# Patient Record
Sex: Female | Born: 1975 | Race: White | Hispanic: No | Marital: Single | Smoking: Never smoker
Health system: Southern US, Community
[De-identification: ages and names within clinical notes are randomized; demographics above are authoritative.]

## PROBLEM LIST (undated history)

## (undated) DIAGNOSIS — C801 Malignant (primary) neoplasm, unspecified: Secondary | ICD-10-CM

## (undated) DIAGNOSIS — I10 Essential (primary) hypertension: Secondary | ICD-10-CM

## (undated) DIAGNOSIS — T148XXA Other injury of unspecified body region, initial encounter: Secondary | ICD-10-CM

## (undated) DIAGNOSIS — I872 Venous insufficiency (chronic) (peripheral): Secondary | ICD-10-CM

## (undated) HISTORY — PX: OPEN ANTERIOR SHOULDER RECONSTRUCTION: SHX2100

## (undated) HISTORY — PX: FOOT SURGERY: SHX648

## (undated) HISTORY — PX: CERVICAL SPINE SURGERY: SHX589

## (undated) HISTORY — DX: Malignant (primary) neoplasm, unspecified: C80.1

## (undated) HISTORY — DX: Essential (primary) hypertension: I10

## (undated) HISTORY — DX: Venous insufficiency (chronic) (peripheral): I87.2

## (undated) HISTORY — PX: THYROIDECTOMY: SHX17

## (undated) HISTORY — DX: Other injury of unspecified body region, initial encounter: T14.8XXA

---

## 2001-09-21 ENCOUNTER — Other Ambulatory Visit: Admission: RE | Admit: 2001-09-21 | Discharge: 2001-09-21 | Payer: Self-pay | Admitting: *Deleted

## 2004-02-01 ENCOUNTER — Inpatient Hospital Stay (HOSPITAL_COMMUNITY): Admission: RE | Admit: 2004-02-01 | Discharge: 2004-02-01 | Payer: Self-pay | Admitting: Neurosurgery

## 2004-03-06 ENCOUNTER — Encounter: Admission: RE | Admit: 2004-03-06 | Discharge: 2004-03-06 | Payer: Self-pay | Admitting: Neurosurgery

## 2004-04-17 ENCOUNTER — Encounter: Admission: RE | Admit: 2004-04-17 | Discharge: 2004-04-17 | Payer: Self-pay | Admitting: Neurosurgery

## 2004-11-20 ENCOUNTER — Other Ambulatory Visit: Admission: RE | Admit: 2004-11-20 | Discharge: 2004-11-20 | Payer: Self-pay | Admitting: *Deleted

## 2004-12-05 ENCOUNTER — Other Ambulatory Visit: Admission: RE | Admit: 2004-12-05 | Discharge: 2004-12-05 | Payer: Self-pay | Admitting: Diagnostic Radiology

## 2005-01-22 ENCOUNTER — Encounter (INDEPENDENT_AMBULATORY_CARE_PROVIDER_SITE_OTHER): Payer: Self-pay | Admitting: *Deleted

## 2005-01-22 ENCOUNTER — Inpatient Hospital Stay (HOSPITAL_COMMUNITY): Admission: RE | Admit: 2005-01-22 | Discharge: 2005-01-23 | Payer: Self-pay | Admitting: Surgery

## 2005-02-12 ENCOUNTER — Encounter (HOSPITAL_COMMUNITY): Admission: RE | Admit: 2005-02-12 | Discharge: 2005-04-02 | Payer: Self-pay | Admitting: Endocrinology

## 2005-02-21 ENCOUNTER — Ambulatory Visit (HOSPITAL_COMMUNITY): Admission: RE | Admit: 2005-02-21 | Discharge: 2005-02-21 | Payer: Self-pay | Admitting: Endocrinology

## 2006-03-19 ENCOUNTER — Encounter: Admission: RE | Admit: 2006-03-19 | Discharge: 2006-03-19 | Payer: Self-pay | Admitting: Endocrinology

## 2006-07-14 ENCOUNTER — Other Ambulatory Visit: Admission: RE | Admit: 2006-07-14 | Discharge: 2006-07-14 | Payer: Self-pay | Admitting: Obstetrics and Gynecology

## 2007-04-29 ENCOUNTER — Encounter: Admission: RE | Admit: 2007-04-29 | Discharge: 2007-04-29 | Payer: Self-pay | Admitting: Endocrinology

## 2008-08-29 ENCOUNTER — Encounter: Admission: RE | Admit: 2008-08-29 | Discharge: 2008-08-29 | Payer: Self-pay | Admitting: Family Medicine

## 2009-05-08 ENCOUNTER — Encounter (HOSPITAL_COMMUNITY): Admission: RE | Admit: 2009-05-08 | Discharge: 2009-07-20 | Payer: Self-pay | Admitting: Endocrinology

## 2010-04-29 ENCOUNTER — Encounter: Payer: Self-pay | Admitting: Endocrinology

## 2010-06-27 LAB — HCG, SERUM, QUALITATIVE

## 2010-08-24 NOTE — Op Note (Signed)
NAMEMULAN, ADAN NO.:  [id number]   MEDICAL RECORD NO.:  [id number]          PATIENT TYPE:  OIB   LOCATION:  2899                         FACILITY:  [facility]   PHYSICIAN:  Donalee Citrin, M.D.        DATE OF BIRTH:  04/07/1976   DATE OF PROCEDURE:  02/01/2004  DATE OF DISCHARGE:                                 OPERATIVE REPORT   PREOPERATIVE DIAGNOSIS:  C6 radiculopathy, left greater than right, from  ruptured disk with cervical spondylosis of C5-C6.   POSTOPERATIVE DIAGNOSIS:  C6 radiculopathy, left greater than right, from  ruptured disk with cervical spondylosis of C5-C6.   PROCEDURE PERFORMED:  Intracervical diskectomy and fusion C5-C6 using a 6-mm  patellar wedge, a 23-mm  __________ plate and four 16-XW variable-angled screws.   SURGEON:  Donalee Citrin, M.D.   ASSISTANT:  Tia Alert, MD   ANESTHESIA:  General endotracheal.   HISTORY OF PRESENT ILLNESS:  The patient is a very pleasant 35 year old  female who has had longstanding neck and bilateral arm pain, worse on the  left, radiating down what appeared to be a C6 distribution.  The patient was  refractory to all forms of conservative treatment with physical therapy,  anti-inflammatories and time.  Preoperative imaging showed severe  degenerative disk disease of C5-C6 with spondylitic compression of the  spinal cord both centrally and to the right.  The patient also had kyphotic  deformity at this level.  Due to the patient's degree of cervical stenosis,  failure of treatment, and C6 radiculopathy, the patient was recommended  intracervical diskectomy and fusion.  I essentially went over the  risks and  benefits of surgery with her and she understands and agreed to proceed  forward.   DESCRIPTION OF PROCEDURE:  The patient was brought to the OR where she  received general anesthesia, positioned supine.  Neck slight extension, 5  pounds Halter traction.  The right side of her neck was prepped and  draped  in the usual sterile fashion.  An intraoperative x-ray was utilized to  localize the C6 vertebral body.  A curvilinear incision was made just off  the midline to the anterior border of the sternocleidomastoid.  The  superficial layer of the platysma was dissected out and divided out  longitudinally and the avascular plane to the sternocleidomastoid and strap  muscles was developed down to the prevertebral fascia.  The fasicia was  dissected away with  Kitners.  Then the fluoroscopic confirmation of the C4-  5 disc space one level above was achieved and the annulotomy was made one  disc space below this at C5-6.  Then the longus colli was reflected  laterally with Bovie electrocautery.  Self-retaining retractor was placed.  Annulotomy was extended and disc space was radically cleaned out with a  combination of pituitary rongeurs, BA upgoing curette and high speed drill.  Then the operating microscope was draped and brought into the field.  Under  microscopic visualization, the under surface of the annulus and posterior  longitudinal ligament was under bitten with a  1 mm Kerrison punch  decompressing the left side of the thecal sac and central canal and the  proximal left C6 nerve root.  The C6 nerve root was immediately identified  and the foramen was opened up with __________  Kerrison punch.  Then  attention was taken to get across the midline.  In the midline and to the  right was a tremendous amount of spinal cord compression and thecal sac  compression from spondylitic spur coming off the C5 vertebral body.  This  was all under bitten very carefully with a 1 to 2 mm Kerrison punch.  We  removed the spur and immediately the thecal sac returned to its anatomic  position and the proximal C6 neural foramen on the right was opened up.  Then both neural foramen were explored with a black nerve hook noted to be  widely patent.  The wound was then copiously irrigated and meticulous   hemostasis was maintained.  There was no further stenosis on the thecal sac  centrally or either C6 nerve root.  __________  bone graft, then a 6 mm  patellar wedge was fashioned, sized and inserted under compression and a 23  mm Atlantis plate was selected and placed with four 13 mm variable-angled  screws.  All screws had excellent purchase.  Set screws tightened down.  Wound was copiously irrigated and meticulous hemostasis was maintained.  Fluoroscopy confirmed good position of plate, screws and bone graft.  Then  platysma was reapproximated with 3-0 interrupted Vicryl and subcutaneous  tissue closed with 4-0 running subcuticular and Benzoin and Steri-Strips  applied.  The patient went to the recovery room in stable condition.  At the  end of the case sponge and needle counts were correct.       GC/MEDQ  D:  02/01/2004  T:  02/01/2004  Job:  [id number]

## 2010-08-24 NOTE — Op Note (Signed)
NAMECHERLYNN, Kaitlyn Mccarthy NO.:  [id number]   MEDICAL RECORD NO.:  [id number]          PATIENT TYPE:  INP   LOCATION:  2899                         FACILITY:  [facility]   PHYSICIAN:  Velora Heckler, MD      DATE OF BIRTH:  [birth year]   DATE OF PROCEDURE:  01/22/2005  DATE OF DISCHARGE:                                 OPERATIVE REPORT   PREOPERATIVE DIAGNOSIS:  Right thyroid nodule with atypia   POSTOPERATIVE DIAGNOSIS:  Papillary thyroid carcinoma   PROCEDURE:  Total thyroidectomy with limited lymph node dissection   SURGEON:  Velora Heckler, M.D.   ASSISTANT:  Francina Ames, M.D.   ANESTHESIA:  General per Dr. 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 [place] in [place]. On routine physical  exam she was found on August 2006, to have a right-sided thyroid nodule. The  patient underwent ultrasound which demonstrated a 2.2 cm solid mass. 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 The patient  was referred for excision.   DESCRIPTION OF PROCEDURE:  Procedure is done in OR #17 at the [facility]. The patient is brought to the operating room, placed in  supine position on the operating room table. Following administration of  general anesthesia, the patient is positioned and then prepped and draped in  usual strict aseptic fashion. After ascertaining that an adequate level of  anesthesia had been obtained, a Kocher incision is made with a #15 blade.  Dissection is carried through subcutaneous tissues and platysma. Hemostasis  is obtained with the electrocautery. Skin flaps are developed cephalad and  caudad from the thyroid notch to the sternal notch. A Mahorner self-  retaining  retractor is placed for exposure. Strap muscles are incised in the  midline. Dissection is begun on the left side of the neck. Strap muscles are  reflected laterally and the left thyroid lobe was exposed. Left thyroid lobe  appears grossly normal. On palpation, there are no masses. There are no  nodules. There is no gross abnormality identified.   Next we turned our attention to the right thyroid lobe. Again strap muscles  are reflected laterally and the right lobe is exposed. Venous tributaries  are dissected out and ligated between small Ligaclips. Gland is rolled  anteriorly. Superior pole is dissected out. Superior pole vessels are  ligated in continuity with 2-0 silk ties and medium Ligaclips and divided.  Inferior venous tributaries are divided between small and medium Ligaclips.  Gland is rolled anteriorly. Superior and inferior parathyroid glands are  identified. Recurrent laryngeal nerve is identified and preserved. Branches  of the inferior thyroid artery are divided between small Ligaclips. Gland is  rolled up and onto the anterior trachea. There is a large 2.5 cm firm mass  in the posterior aspect of the mid portion of  the right thyroid lobe. This  is worrisome for papillary thyroid cancer. Remaining branches of the  inferior artery are divided and the gland is excised off of the trachea  after dividing the ligament of Berry. The gland is mobilized across the  isthmus and the isthmus is divided between hemostats and suture ligated with  3-0 Vicryl suture ligature. Entire right lobe is submitted to pathology for  frozen section. Frozen section demonstrates findings consistent with  papillary thyroid carcinoma. Palpable lymph nodes in the right neck are then  carefully dissected out. These are in the right paratracheal and in the  central compartment. These are excised and submitted separately to pathology  for review.   Next we turned our attention to the left thyroid lobe.  Left thyroid lobe is  mobilized. After receiving a frozen section diagnosis from Dr. Berneta Levins  of papillary thyroid cancer, a decision is made to proceed with left thyroid  lobectomy. Venous tributaries are divided between small Ligaclips. Inferior  venous tributaries are divided between medium Ligaclips. Superior pole is  dissected out. Superior pole vessels are ligated in continuity with 2-0 silk  ties and medium Ligaclips and divided. Gland is rolled anteriorly. Superior  and inferior parathyroid glands are identified and preserved. Recurrent  laryngeal nerve is identified and preserved. Branches of the inferior  thyroid artery are divided between small Ligaclips. Ligament of Allyson Sabal is  transected with the electrocautery and the remaining gland and large  pyramidal lobe are excised off of the trachea and thyroid cartilage with  electrocautery. This was submitted to pathology for review. Good hemostasis  is noted both sides of the neck. Surgicel was placed over the area of the  recurrent nerve and parathyroid glands bilaterally. Further palpation  reveals no other significant lymphadenopathy. Strap muscles are  reapproximated in the midline with interrupted 3-0 Vicryl sutures. Platysma  is closed with interrupted 3-0 Vicryl sutures. Skin edges are reapproximated  with a running 4-0 Vicryl subcuticular suture. Wound is washed and dried.  Benzoin and Steri-Strips are applied. Sterile dressings are applied. The  patient is awakened from anesthesia and brought to the recovery room in  stable condition. The patient tolerated the procedure well.      Velora Heckler, MD  Electronically Signed     TMG/MEDQ  D:  01/22/2005  T:  01/22/2005  Job:  [id number]   cc:   Maren Reamer, N.PDeboraha Sprang Physicians

## 2011-11-13 ENCOUNTER — Encounter: Payer: Self-pay | Admitting: Vascular Surgery

## 2011-12-03 ENCOUNTER — Encounter: Payer: Self-pay | Admitting: Vascular Surgery

## 2011-12-04 ENCOUNTER — Encounter: Payer: Self-pay | Admitting: Vascular Surgery

## 2011-12-04 ENCOUNTER — Ambulatory Visit (INDEPENDENT_AMBULATORY_CARE_PROVIDER_SITE_OTHER): Payer: BC Managed Care – PPO | Admitting: Vascular Surgery

## 2011-12-04 ENCOUNTER — Ambulatory Visit (INDEPENDENT_AMBULATORY_CARE_PROVIDER_SITE_OTHER): Payer: BC Managed Care – PPO | Admitting: *Deleted

## 2011-12-04 VITALS — BP 136/78 | HR 96 | Resp 16 | Ht 67.5 in | Wt 213.0 lb

## 2011-12-04 DIAGNOSIS — I83893 Varicose veins of bilateral lower extremities with other complications: Secondary | ICD-10-CM

## 2011-12-04 NOTE — Progress Notes (Signed)
[facility] [place]  Patient name: Kaitlyn Mccarthy MRN: [id number] DOB: March 09, 1976 Sex: female  REASON FOR CONSULT: left leg pain related to chronic venous insufficiency.  HPI: Kaitlyn Mccarthy is a 35 y.o. female who underwent laser ablation of her right greater saphenous vein in April of this year at [facility]. She had significant truncal varicosities in the right leg and also incompetence of the right greater saphenous vein. After the procedure she had difficulty bending her knee and had significant pain and ecchymosis along the medial aspect of her right thigh and around the knee joint. This has subsequently resolved.  Currently she is having significant aching pain in her left leg associated with standing. Unfortunately she asked to stand for a prolonged period of time at work. She experiences tiredness in the left leg and has been wearing thigh-high compression stockings with a 20-30 mm of mercury pressure gradient without significant relief. She wishes to consider other options.  She is unaware of any history of DVT. It sounds like she did have some phlebitis in the right leg after her previous procedure. 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  Past Medical History  Diagnosis Date  . Cancer     thyroid    Family History  Problem Relation Age of Onset  . Cancer Mother     testicular  . Hyperlipidemia Mother   . Hypertension Mother   . Hyperlipidemia Father   . Hypertension Father     SOCIAL HISTORY: History  Substance Use Topics  . Smoking status: Never Smoker   . Smokeless tobacco: Never Used  . Alcohol Use: No    Allergies  Allergen Reactions  . Erythromycin     Pt states that she has severe stomach pain    Current Outpatient Prescriptions  Medication Sig Dispense Refill  . metoprolol succinate (TOPROL-XL) 50 MG 24 hr tablet Take 50 mg by mouth as needed. Take with or immediately following a meal.      .  SYNTHROID 112 MCG tablet Take 112 mcg by mouth daily.        REVIEW OF SYSTEMS: Arly.Keller ] denotes positive finding; [  ] denotes negative finding  CARDIOVASCULAR:  [ ]  chest pain   [ ]  chest pressure   Arly.Keller ] palpitations   [ ]  orthopnea   [ ]  dyspnea on exertion   [ ]  claudication   [ ]  rest pain   [ ]  DVT   [ ]  phlebitis PULMONARY:   [ ]  productive cough   [ ]  asthma   [ ]  wheezing NEUROLOGIC:   [ ]  weakness  [ ]  paresthesias  [ ]  aphasia  [ ]  amaurosis  [ ]  dizziness HEMATOLOGIC:   [ ]  bleeding problems   [ ]  clotting disorders MUSCULOSKELETAL:  [ ]  joint pain   [ ]  joint swelling Arly.Keller ] leg swelling GASTROINTESTINAL: [ ]   blood in stool  [ ]   hematemesis GENITOURINARY:  [ ]   dysuria  [ ]   hematuria PSYCHIATRIC:  [ ]  history of major depression INTEGUMENTARY:  [ ]  rashes  [ ]  ulcers CONSTITUTIONAL:  [ ]  fever   [ ]  chills  PHYSICAL EXAM: Filed Vitals:   12/04/11 1028  BP: 136/78  Pulse: 96  Resp: 16  Height: 5' 7.5" (1.715 m)  Weight: 213 lb (96.616 kg)  SpO2: 100%   Body mass index is 32.87 kg/(m^2). GENERAL: The patient is a well-nourished female, in  no acute distress. The vital signs are documented above. CARDIOVASCULAR: There is a regular rate and rhythm without significant murmur appreciated. I do not detect carotid bruits. She has palpable popliteal and posterior tibial pulses bilaterally. PULMONARY: There is good air exchange bilaterally without wheezing or rales. ABDOMEN: Soft and non-tender with normal pitched bowel sounds.  MUSCULOSKELETAL: There are no major deformities or cyanosis. NEUROLOGIC: No focal weakness or paresthesias are detected. SKIN: she has multiple small indurated areas were she had sclerotherapy in the right leg. On the left side she has spider veins and some larger varicose veins along the medial distal aspect of her right side. PSYCHIATRIC: The patient has a normal affect.  DATA:  I have independently interpreted her venous duplex scan in reflux study.  This shows incompetence of the left greater saphenous vein to the proximal calf. There is also some deep vein reflux. Diameters of the vein range from 0.22 cm to 0.67 cm at the saphenofemoral junction. The vein appears to be fairly large in the proximal thigh but is smaller distally near the knee.  MEDICAL ISSUES: This patient has significant left leg pain associated with her venous insufficiency in the left leg. This pain has persisted despite wearing her compression stockings since her procedure in April. I think it would be reasonable to proceed with laser ablation of the left greater saphenous vein and have discussed this with Dr. Arbie Cookey. She currently has her thigh high compression stockings with a 20-30 mm mercury gradient which she will wear her after the procedure. We have discussed the procedure with her today in the office all of her questions were answered. We'll try to schedule a that appointment within the next month.   DICKSON,CHRISTOPHER S [facility] [place] Beeper: [phone]

## 2011-12-10 ENCOUNTER — Other Ambulatory Visit: Payer: Self-pay | Admitting: *Deleted

## 2011-12-10 DIAGNOSIS — I83893 Varicose veins of bilateral lower extremities with other complications: Secondary | ICD-10-CM

## 2011-12-11 ENCOUNTER — Encounter: Payer: Self-pay | Admitting: Vascular Surgery

## 2012-01-22 ENCOUNTER — Encounter: Payer: Self-pay | Admitting: Vascular Surgery

## 2012-01-23 ENCOUNTER — Ambulatory Visit (INDEPENDENT_AMBULATORY_CARE_PROVIDER_SITE_OTHER): Payer: BC Managed Care – PPO | Admitting: Vascular Surgery

## 2012-01-23 ENCOUNTER — Encounter: Payer: Self-pay | Admitting: Vascular Surgery

## 2012-01-23 VITALS — BP 137/97 | HR 85 | Resp 18 | Ht 67.0 in | Wt 211.0 lb

## 2012-01-23 DIAGNOSIS — I872 Venous insufficiency (chronic) (peripheral): Secondary | ICD-10-CM | POA: Insufficient documentation

## 2012-01-23 DIAGNOSIS — I83893 Varicose veins of bilateral lower extremities with other complications: Secondary | ICD-10-CM

## 2012-01-23 HISTORY — PX: ENDOVENOUS ABLATION SAPHENOUS VEIN W/ LASER: SUR449

## 2012-01-23 NOTE — Progress Notes (Signed)
Laser Ablation Procedure      Date: 01/23/2012    Kaitlyn Mccarthy DOB:[birth year]  Consent signed: Yes  Surgeon:T.F. Joron Velis  Procedure: Laser Ablation: left Greater Saphenous Vein  BP 137/97  Pulse 85  Resp 18  Ht 5\' 7"  (1.702 m)  Wt 211 lb (95.709 kg)  BMI 33.05 kg/m2  Start time: 8:30am   End time: 9:30am  Tumescent Anesthesia: 400 cc 0.9% NaCl with 50 cc Lidocaine HCL with 1% Epi and 15 cc 8.4% NaHCO3  Local Anesthesia: 3 cc Lidocaine HCL and NaHCO3 (ratio 2:1)  Continuous Mode: 15 Watts Total Energy 2209 Joules Total Time2:27       Patient tolerated procedure well: Yes    Description of Procedure:  After marking the course of the saphenous vein and the secondary varicosities in the standing position, the patient was placed on the operating table in the supine position, and the left leg was prepped and draped in sterile fashion. Local anesthetic was administered, and under ultrasound guidance the saphenous vein was accessed with a micro needle and guide wire; then the micro puncture sheath was placed. A guide wire was inserted to the saphenofemoral junction, followed by a 5 french sheath.  The position of the sheath and then the laser fiber below the junction was confirmed using the ultrasound and visualization of the aiming beam.  Tumescent anesthesia was administered along the course of the saphenous vein using ultrasound guidance. Protective laser glasses were placed on the patient, and the laser was fired at at 15 watt continuous mode.  For a total of 2209 joules.  A steri strip was applied to the puncture site.      ABD pads and thigh high compression stockings were applied.  Ace wrap bandages were applied  at the top of the saphenofemoral junction.  Blood loss was less than 15 cc.  The patient ambulated out of the operating room having tolerated the procedure well.  Uneventful ablation of her left great saphenous vein from the proximal calf to the just below the  saphenofemoral junction. Will be seen again in one week

## 2012-01-27 ENCOUNTER — Telehealth: Payer: Self-pay | Admitting: *Deleted

## 2012-01-27 NOTE — Telephone Encounter (Signed)
01/27/2012  Time: 1:49 PM   Patient Name: Kaitlyn Mccarthy  Patient of: T.F. Early  Procedure:Laser Ablation left  01-23-2012   Reached patient at home and checked  Her status  Yes    Comments/Actions Taken: Ms. Shuffield states that she has minimal discomfort left inner thigh relieved by Ibuprofen and ice packs.  Ms. Collet states no bleeding or oozing today but that she did experience small amount of bleeding on her ABD pad yesterday (01-23-2012) that stopped spontaneously.  Reviewed post procedural instructions and answered  her questions about exercise.  Reminded her of post laser ablation duplex and follow up appointment with Dr. Arbie Cookey on 01-30-2012.      @SIGNATURE @

## 2012-01-29 ENCOUNTER — Encounter: Payer: Self-pay | Admitting: Vascular Surgery

## 2012-01-30 ENCOUNTER — Encounter (INDEPENDENT_AMBULATORY_CARE_PROVIDER_SITE_OTHER): Payer: BC Managed Care – PPO | Admitting: *Deleted

## 2012-01-30 ENCOUNTER — Ambulatory Visit (INDEPENDENT_AMBULATORY_CARE_PROVIDER_SITE_OTHER): Payer: BC Managed Care – PPO | Admitting: Vascular Surgery

## 2012-01-30 ENCOUNTER — Encounter: Payer: Self-pay | Admitting: Vascular Surgery

## 2012-01-30 VITALS — BP 140/83 | HR 77 | Resp 18 | Ht 67.0 in | Wt 211.0 lb

## 2012-01-30 DIAGNOSIS — M7989 Other specified soft tissue disorders: Secondary | ICD-10-CM

## 2012-01-30 DIAGNOSIS — Z48812 Encounter for surgical aftercare following surgery on the circulatory system: Secondary | ICD-10-CM

## 2012-01-30 DIAGNOSIS — I83893 Varicose veins of bilateral lower extremities with other complications: Secondary | ICD-10-CM

## 2012-01-30 NOTE — Progress Notes (Signed)
The patient presents today for followup of her left great saphenous vein laser ablation one week ago. She had her right leg procedure done in an outlying vein center in [place] discomfort at and not a very doesn't experience. Portion she had minimal discomfort with the procedure last week and has had mild discomfort since the procedure.  Past Medical History  Diagnosis Date  . Cancer     thyroid  . Venous insufficiency     History  Substance Use Topics  . Smoking status: Never Smoker   . Smokeless tobacco: Never Used  . Alcohol Use: No    Family History  Problem Relation Age of Onset  . Cancer Mother     testicular  . Hyperlipidemia Mother   . Hypertension Mother   . Hyperlipidemia Father   . Hypertension Father     Allergies  Allergen Reactions  . Erythromycin     Pt states that she has severe stomach pain    Current outpatient prescriptions:metoprolol succinate (TOPROL-XL) 50 MG 24 hr tablet, Take 50 mg by mouth as needed. Take with or immediately following a meal., Disp: , Rfl: ;  SYNTHROID 112 MCG tablet, Take 112 mcg by mouth daily., Disp: , Rfl:   BP 140/83  Pulse 77  Resp 18  Ht 5\' 7"  (1.702 m)  Wt 211 lb (95.709 kg)  BMI 33.05 kg/m2  Body mass index is 33.05 kg/(m^2).       Physical exam reveals mild bruising on the medial aspect of her thigh. She has no evidence of injury to her skin. She did undergo a venous duplex today and that shows no evidence of DVT and also for chest closure of her great saphenous vein throughout its treatment source.  Impression and plan successful laser ablation of left great saphenous vein. She will continue her compression garments for an additional one week and then on a when necessary basis. She will resume full activity

## 2014-03-22 ENCOUNTER — Other Ambulatory Visit: Payer: Self-pay | Admitting: Endocrinology

## 2014-03-22 DIAGNOSIS — C73 Malignant neoplasm of thyroid gland: Secondary | ICD-10-CM

## 2014-04-25 ENCOUNTER — Encounter (HOSPITAL_COMMUNITY)
Admission: RE | Admit: 2014-04-25 | Discharge: 2014-04-25 | Disposition: A | Discharge status: Home / Self Care | Payer: BLUE CROSS/BLUE SHIELD | Source: Ambulatory Visit | Attending: Endocrinology | Admitting: Endocrinology

## 2014-04-25 DIAGNOSIS — Z9889 Other specified postprocedural states: Secondary | ICD-10-CM | POA: Insufficient documentation

## 2014-04-25 DIAGNOSIS — C73 Malignant neoplasm of thyroid gland: Secondary | ICD-10-CM | POA: Insufficient documentation

## 2014-04-25 MED ORDER — THYROTROPIN ALFA 1.1 MG IM SOLR
0.9000 mg | INTRAMUSCULAR | Status: AC
  Administered 2014-04-25: 0.9 mg via INTRAMUSCULAR
  Filled 2014-04-25: qty 0.9

## 2014-04-26 ENCOUNTER — Encounter (HOSPITAL_COMMUNITY)
Admission: RE | Admit: 2014-04-26 | Discharge: 2014-04-26 | Disposition: A | Discharge status: Home / Self Care | Payer: BLUE CROSS/BLUE SHIELD | Source: Ambulatory Visit | Attending: Endocrinology | Admitting: Endocrinology

## 2014-04-26 DIAGNOSIS — C73 Malignant neoplasm of thyroid gland: Secondary | ICD-10-CM | POA: Diagnosis not present

## 2014-04-26 MED ORDER — THYROTROPIN ALFA 1.1 MG IM SOLR
0.9000 mg | INTRAMUSCULAR | Status: AC
  Administered 2014-04-26: 0.9 mg via INTRAMUSCULAR
  Filled 2014-04-26: qty 0.9

## 2014-04-27 ENCOUNTER — Encounter (HOSPITAL_COMMUNITY)
Admission: RE | Admit: 2014-04-27 | Discharge: 2014-04-27 | Disposition: A | Discharge status: Home / Self Care | Payer: BLUE CROSS/BLUE SHIELD | Source: Ambulatory Visit | Attending: Endocrinology | Admitting: Endocrinology

## 2014-04-27 DIAGNOSIS — C73 Malignant neoplasm of thyroid gland: Secondary | ICD-10-CM | POA: Diagnosis not present

## 2014-04-27 LAB — HCG, SERUM, QUALITATIVE: Preg, Serum: NEGATIVE

## 2014-04-29 ENCOUNTER — Encounter (HOSPITAL_COMMUNITY)
Admission: RE | Admit: 2014-04-29 | Discharge: 2014-04-29 | Disposition: A | Discharge status: Home / Self Care | Payer: BLUE CROSS/BLUE SHIELD | Source: Ambulatory Visit | Attending: Endocrinology | Admitting: Endocrinology

## 2014-04-29 ENCOUNTER — Encounter (HOSPITAL_COMMUNITY): Payer: Self-pay

## 2014-04-29 DIAGNOSIS — C73 Malignant neoplasm of thyroid gland: Secondary | ICD-10-CM | POA: Diagnosis not present

## 2014-04-29 MED ORDER — SODIUM IODIDE I 131 CAPSULE
4.2000 | Freq: Once | INTRAVENOUS | Status: AC | PRN
  Administered 2014-04-29: 4.2 via ORAL

## 2014-12-07 ENCOUNTER — Other Ambulatory Visit: Payer: Self-pay | Admitting: Family Medicine

## 2014-12-07 ENCOUNTER — Ambulatory Visit
Admission: RE | Admit: 2014-12-07 | Discharge: 2014-12-07 | Disposition: A | Discharge status: Home / Self Care | Payer: BLUE CROSS/BLUE SHIELD | Source: Ambulatory Visit | Attending: Family Medicine | Admitting: Family Medicine

## 2014-12-07 DIAGNOSIS — M542 Cervicalgia: Secondary | ICD-10-CM

## 2016-06-22 ENCOUNTER — Emergency Department (HOSPITAL_BASED_OUTPATIENT_CLINIC_OR_DEPARTMENT_OTHER)
Admission: EM | Admit: 2016-06-22 | Discharge: 2016-06-22 | Disposition: A | Discharge status: Home / Self Care | Payer: No Typology Code available for payment source | Attending: Emergency Medicine | Admitting: Emergency Medicine

## 2016-06-22 ENCOUNTER — Encounter (HOSPITAL_BASED_OUTPATIENT_CLINIC_OR_DEPARTMENT_OTHER): Payer: Self-pay | Admitting: Emergency Medicine

## 2016-06-22 DIAGNOSIS — T380X5A Adverse effect of glucocorticoids and synthetic analogues, initial encounter: Secondary | ICD-10-CM | POA: Diagnosis not present

## 2016-06-22 DIAGNOSIS — T50905A Adverse effect of unspecified drugs, medicaments and biological substances, initial encounter: Secondary | ICD-10-CM

## 2016-06-22 DIAGNOSIS — R202 Paresthesia of skin: Secondary | ICD-10-CM | POA: Diagnosis present

## 2016-06-22 MED ORDER — LORAZEPAM 1 MG PO TABS
1.0000 mg | ORAL_TABLET | Freq: Once | ORAL | Status: AC
  Administered 2016-06-22: 1 mg via ORAL
  Filled 2016-06-22: qty 1

## 2016-06-22 NOTE — ED Provider Notes (Signed)
[facility] DEPT [facility] Provider Note   CSN: [id number] Arrival date & time: 06/22/16  1500  By signing my name below, I, [name], attest that this documentation has been prepared under the direction and in the presence of non-physician practitioner, Janetta Hora, PA-C. Electronically Signed: [name], Scribe. 06/22/2016. 5:31 PM.  History   Chief Complaint Chief Complaint  Patient presents with  . Medication Reaction   The history is provided by the patient. No language interpreter was used.   HPI Comments: Kaitlyn Mccarthy is a 41 y.o. female who presents to the Emergency Department for a medication reaction that started 3 days ago. She has been taking daily 50 mg Prednisone for back pain with sciatica. The steroid has improved her back pain but since taking it she has had associated anxiety and facial tightness/tingling sensation as well as hyperactivity. She feels as if her "face is swollen but it is not". She halved her dose since 2 days ago, which has made her symptoms worse. Her last dose was 7.5 hours ago (10am) and she subsequently developed symptoms again. She denies any throat swelling, wheezing, numbness, or other complaints. She states she has taken prednisone in the past without a reaction but has never taken this high of a dose.   PCP: Vidal Schwalbe, MD  Past Medical History:  Diagnosis Date  . Cancer ([place])    thyroid  . Venous insufficiency     Patient Active Problem List   Diagnosis Date Noted  . Unspecified venous (peripheral) insufficiency 01/23/2012  . Varicose veins of lower extremities with other complications 51/76/1607    Past Surgical History:  Procedure Laterality Date  . CERVICAL SPINE SURGERY    . ENDOVENOUS ABLATION SAPHENOUS VEIN W/ LASER  01-23-2012   left greater saphenous vein by  Dr. Donnetta Hutching  . FOOT SURGERY    . THYROIDECTOMY      OB History    No data available       Home Medications    Prior to Admission medications     Medication Sig Start Date End Date Taking? Authorizing Provider  metoprolol succinate (TOPROL-XL) 50 MG 24 hr tablet Take 50 mg by mouth as needed. Take with or immediately following a meal.   Yes Historical Provider, MD  SYNTHROID 112 MCG tablet Take 112 mcg by mouth daily. 11/06/11  Yes Historical Provider, MD    Family History Family History  Problem Relation Age of Onset  . Cancer Mother     testicular  . Hyperlipidemia Mother   . Hypertension Mother   . Hyperlipidemia Father   . Hypertension Father     Social History Social History  Substance Use Topics  . Smoking status: Never Smoker  . Smokeless tobacco: Never Used  . Alcohol use No     Allergies   Erythromycin; Prednisone; and Vancomycin   Review of Systems Review of Systems  HENT: Negative for trouble swallowing.        +Facial tightness/tingling  Respiratory: Negative for wheezing.   Neurological: Negative for numbness.  Psychiatric/Behavioral: The patient is nervous/anxious.   All other systems reviewed and are negative.    Physical Exam Updated Vital Signs BP (!) 160/105 (BP Location: Right Arm)   Pulse 97   Temp 98.5 F (36.9 C) (Oral)   Resp 20   Ht 5\' 7"  (1.702 m)   Wt 215 lb (97.5 kg)   LMP 05/28/2016   SpO2 100%   BMI 33.67 kg/m   Physical Exam  Constitutional: She appears well-developed and well-nourished. No distress.  Speaking in full sentences.   HENT:  Head: Normocephalic.  No throat swelling.   Eyes: Conjunctivae are normal.  Neck: Neck supple.  Cardiovascular: Normal rate and regular rhythm.  Exam reveals no gallop and no friction rub.   No murmur heard. Pulmonary/Chest: Effort normal and breath sounds normal. No respiratory distress. She has no wheezes. She has no rales. She exhibits no tenderness.  Abdominal: Soft.  Musculoskeletal: Normal range of motion.  Neurological: She is alert.  Skin: Skin is warm and dry.  Psychiatric: She has a normal mood and affect.  Nursing  note and vitals reviewed.    ED Treatments / Results  DIAGNOSTIC STUDIES: Oxygen Saturation is 100% on RA, normal by my interpretation.    COORDINATION OF CARE: 5:35 PM- Pt advised of plan for treatment and pt agrees.  Labs (all labs ordered are listed, but only abnormal results are displayed) Labs Reviewed - No data to display  EKG  EKG Interpretation None       Radiology No results found.  Procedures Procedures (including critical care time)  Medications Ordered in ED Medications  LORazepam (ATIVAN) tablet 1 mg (1 mg Oral Given 06/22/16 1806)     Initial Impression / Assessment and Plan / ED Course  I have reviewed the triage vital signs and the nursing notes.  Pertinent labs & imaging results that were available during my care of the patient were reviewed by me and considered in my medical decision making (see chart for details).  41 year old female with med reaction to prednisone. No signs of allergic reaction. Ativan given in ED. Advised stop Prednisone since back has improved. Follow up with PCP. Return precautions given.  Final Clinical Impressions(s) / ED Diagnoses   Final diagnoses:  Medication reaction, initial encounter    New Prescriptions New Prescriptions   No medications on file   I personally performed the services described in this documentation, which was scribed in my presence. The recorded information has been reviewed and is accurate.     Recardo Evangelist, PA-C 06/23/16 1207    Quintella Reichert, MD 06/23/16 [phone]

## 2016-06-22 NOTE — ED Triage Notes (Signed)
Pt has been taking prednisone once per day since Wednesday. Pt states she feels anxious and her face feels tight every time she takes it.

## 2016-06-22 NOTE — ED Notes (Signed)
Pt discharged to home with family. NAD.  

## 2016-06-22 NOTE — Discharge Instructions (Signed)
Stop Prednisone Take Benadryl as needed for anxiety Return for worsening symptoms

## 2016-06-22 NOTE — ED Notes (Signed)
Pt states she has been taking Prednisone and feels like she is now allergic to it. States she feels like her face is swollen and has anxiety. Family with patient states her face looks like her normal and does not appear to be swollen compared to her norm. Pt acts nervous and is shaking.

## 2017-04-18 ENCOUNTER — Ambulatory Visit (INDEPENDENT_AMBULATORY_CARE_PROVIDER_SITE_OTHER): Payer: No Typology Code available for payment source | Admitting: Internal Medicine

## 2017-04-18 ENCOUNTER — Encounter (INDEPENDENT_AMBULATORY_CARE_PROVIDER_SITE_OTHER): Payer: Self-pay

## 2017-04-18 ENCOUNTER — Encounter: Payer: Self-pay | Admitting: Internal Medicine

## 2017-04-18 VITALS — BP 152/96 | HR 90 | Resp 16 | Ht 67.5 in | Wt 224.8 lb

## 2017-04-18 DIAGNOSIS — E890 Postprocedural hypothyroidism: Secondary | ICD-10-CM | POA: Diagnosis not present

## 2017-04-18 DIAGNOSIS — R002 Palpitations: Secondary | ICD-10-CM

## 2017-04-18 NOTE — Patient Instructions (Signed)
Dr. Debara Pickett recommends that you schedule a follow-up appointment as needed.

## 2017-04-19 ENCOUNTER — Encounter: Payer: Self-pay | Admitting: Internal Medicine

## 2017-04-19 DIAGNOSIS — E890 Postprocedural hypothyroidism: Secondary | ICD-10-CM | POA: Insufficient documentation

## 2017-04-19 DIAGNOSIS — R002 Palpitations: Secondary | ICD-10-CM | POA: Insufficient documentation

## 2017-04-19 NOTE — Progress Notes (Signed)
OFFICE CONSULT NOTE  Chief Complaint:  Palpitations  Primary Care Physician: Maury Dus, MD  HPI:  Kaitlyn Mccarthy is a 42 y.o. female who is being seen today for the evaluation of palpitations at the request of Damaris Hippo, MD. 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 She has recently been having palpitations. She had had them in the past and thought they were PVC's. She has a history of hyperthyroidism and is s/p radioiodine ablation. Recently she was noted to be hyperthyroid and her dose was adjusted down, but she neglected to change her medicine. Her palpitations were worse afterwards and have improved with decreasing her thyroid supplementation dose. She has also stopped caffeine recently. She reports being in a high stress job with little support in the office - this is when she feels most of her palpitations. She rarely has symptoms on the weekends and is working with her father to renovate her house.  PMHx:  Past Medical History:  Diagnosis Date  . Cancer ([place])    thyroid  . Hypertension   . Venous insufficiency     Past Surgical History:  Procedure Laterality Date  . CERVICAL SPINE SURGERY    . ENDOVENOUS ABLATION SAPHENOUS VEIN W/ LASER  01-23-2012   left greater saphenous vein by  Dr. Donnetta Hutching  . FOOT SURGERY    . THYROIDECTOMY      FAMHx:  Family History  Problem Relation Age of Onset  . Cancer Mother        testicular  . Hyperlipidemia Mother   . Hypertension Mother   . Hyperlipidemia Father   . Hypertension Father     SOCHx:   reports that  has never smoked. she has never used smokeless tobacco. She reports that she does not drink alcohol or use drugs.  ALLERGIES:  Allergies  Allergen Reactions  . Erythromycin     Pt states that she has severe stomach pain  . Meloxicam Swelling    Ankle swelling   . Prednisone     With high doses (50mg )  . Lisinopril Cough  . Losartan Other (See Comments)    Tremors, lightheaded   .  Vancomycin     ROS: Pertinent items noted in HPI and remainder of comprehensive ROS otherwise negative.  HOME MEDS: Current Outpatient Medications on File Prior to Visit  Medication Sig Dispense Refill  . cetirizine (ZYRTEC) 10 MG tablet Take 10 mg by mouth at bedtime.    . hydrochlorothiazide (HYDRODIURIL) 12.5 MG tablet TAKE 1 TABLET BY MOUTH EVERY DAY IN THE MORNING  1  . LORazepam (ATIVAN) 0.5 MG tablet TAKE 1 TABLET AS NEEDED EVERY 8 HRS ORALLY  1  . metoprolol succinate (TOPROL-XL) 50 MG 24 hr tablet Take 50 mg by mouth as needed. Take with or immediately following a meal.    . SYNTHROID 125 MCG tablet PLEASE SEE ATTACHED FOR DETAILED DIRECTIONS  1   No current facility-administered medications on file prior to visit.     LABS/IMAGING: No results found for this or any previous visit (from the past 48 hour(s)). No results found.  LIPID PANEL: No results found for: CHOL, TRIG, HDL, CHOLHDL, VLDL, LDLCALC, LDLDIRECT  WEIGHTS: Wt Readings from Last 3 Encounters:  04/18/17 224 lb 12.8 oz (102 kg)  06/22/16 215 lb (97.5 kg)  01/30/12 211 lb (95.7 kg)    VITALS: BP (!) 152/96   Pulse 90   Resp 16   Ht 5' 7.5" (1.715  m)   Wt 224 lb 12.8 oz (102 kg)   LMP 03/30/2017   SpO2 100%   BMI 34.69 kg/m   EXAM: General appearance: alert and no distress Neck: no carotid bruit, no JVD and thyroid not enlarged, symmetric, no tenderness/mass/nodules Lungs: clear to auscultation bilaterally Heart: regular rate and rhythm, S1, S2 normal, no murmur, click, rub or gallop Abdomen: soft, non-tender; bowel sounds normal; no masses,  no organomegaly Extremities: extremities normal, atraumatic, no cyanosis or edema Pulses: 2+ and symmetric Skin: Skin color, texture, turgor normal. No rashes or lesions Neurologic: Grossly normal Psych: Pleasant  EKG: NSR At 85, QTC 495 msec - personally reviewed  ASSESSMENT: 1. Palpitations, likely related to hyperthyroidism  PLAN: 1.   Jenine  reports her palpitations have improved with the decrease in her thyroid medicine, reduction in caffeine and attempts to control stress. No further workup is necessary. She should try to keep the TSH at the upper level of normal range to reduce the chance of palpitations. Exercise, yoga or mindfulness activities may help mitigate her work stress.  Follow-up with me as needed.  Pixie Casino, MD, [facility], [place] Director of the [facility] Lipid Disorders &  Cardiovascular [facility] Reduction [facility] Diplomate of the American Board of Clinical Lipidology Attending Cardiologist  Direct Dial: [phone]  Fax: [phone]  Website:  [email or web address] 04/19/2017, 2:24 PM

## 2017-04-20 ENCOUNTER — Telehealth: Payer: Self-pay | Admitting: Internal Medicine

## 2017-04-20 NOTE — Telephone Encounter (Signed)
Cardiology Moonlighter Note  Returned page from patient. Feeling intermittent palpitations for the past couple of weeks. Saw Dr. Debara Pickett 2 days ago. Metoprolol dose was adjusted. Taking metoprolol succinate 50mg  once daily. Formerly was taking metoprolol tartrate, had some leftover tartrate tablets. Took an extra one and symptoms nearly resolved. Patient frustrated that she is having symptoms despite taking several different medications. She understands that her symptoms are likely caused by her thyroid dysfunction, which has been abnormal. She is currently having her synthroid dose adjusted.   I discussed with the patient a few options regarding her medications that could treat her symptoms. First option was to continue taking metoprolol succinate 50mg  daily in the AM and then just take metoprolol tartrate 25mg  as needed for palpitations. She could also take metoprolol succinate 50mg  twice daily for better 24 hour coverage in hopes that this would prevent her palpitations from recurring and making metoprolol tartrate unnecessary. She notes that her resting heart rates now are around 70 bpm and she is experiencing no adverse effects from metoprolol.   The patient stated that she prefers to try taking metoprolol succinate 50mg  BID to hopefully prevent future palpitations while her tyroid level is corrected. I will cc this note to Dr. Debara Pickett so that he is aware of this change. The patient will call back tonight if she experiences any more issues or has any questions.  Marcie Mowers, MD Cardiology Fellow, PGY-5

## 2019-06-04 ENCOUNTER — Other Ambulatory Visit: Payer: Self-pay | Admitting: Family Medicine

## 2019-06-04 DIAGNOSIS — Z1231 Encounter for screening mammogram for malignant neoplasm of breast: Secondary | ICD-10-CM

## 2019-06-06 DIAGNOSIS — H1032 Unspecified acute conjunctivitis, left eye: Secondary | ICD-10-CM | POA: Diagnosis not present

## 2019-06-06 DIAGNOSIS — S0502XA Injury of conjunctiva and corneal abrasion without foreign body, left eye, initial encounter: Secondary | ICD-10-CM | POA: Diagnosis not present

## 2019-08-16 ENCOUNTER — Ambulatory Visit: Payer: No Typology Code available for payment source

## 2019-09-02 DIAGNOSIS — E039 Hypothyroidism, unspecified: Secondary | ICD-10-CM | POA: Diagnosis not present

## 2019-09-02 DIAGNOSIS — E559 Vitamin D deficiency, unspecified: Secondary | ICD-10-CM | POA: Diagnosis not present

## 2019-09-02 DIAGNOSIS — J309 Allergic rhinitis, unspecified: Secondary | ICD-10-CM | POA: Diagnosis not present

## 2019-09-02 DIAGNOSIS — I10 Essential (primary) hypertension: Secondary | ICD-10-CM | POA: Diagnosis not present

## 2019-09-21 DIAGNOSIS — M25562 Pain in left knee: Secondary | ICD-10-CM | POA: Diagnosis not present

## 2019-09-27 ENCOUNTER — Ambulatory Visit
Admission: RE | Admit: 2019-09-27 | Discharge: 2019-09-27 | Disposition: A | Discharge status: Home / Self Care | Payer: No Typology Code available for payment source | Source: Ambulatory Visit | Attending: Family Medicine | Admitting: Family Medicine

## 2019-09-27 ENCOUNTER — Other Ambulatory Visit: Payer: Self-pay

## 2019-09-27 DIAGNOSIS — Z1231 Encounter for screening mammogram for malignant neoplasm of breast: Secondary | ICD-10-CM

## 2019-11-09 DIAGNOSIS — J0100 Acute maxillary sinusitis, unspecified: Secondary | ICD-10-CM | POA: Diagnosis not present

## 2019-11-10 DIAGNOSIS — Z20822 Contact with and (suspected) exposure to covid-19: Secondary | ICD-10-CM | POA: Diagnosis not present

## 2019-11-12 DIAGNOSIS — H33302 Unspecified retinal break, left eye: Secondary | ICD-10-CM | POA: Diagnosis not present

## 2019-11-16 ENCOUNTER — Other Ambulatory Visit: Payer: Self-pay | Admitting: Family Medicine

## 2019-11-16 ENCOUNTER — Ambulatory Visit
Admission: RE | Admit: 2019-11-16 | Discharge: 2019-11-16 | Disposition: A | Discharge status: Home / Self Care | Payer: BC Managed Care – PPO | Source: Ambulatory Visit | Attending: Family Medicine | Admitting: Family Medicine

## 2019-11-16 DIAGNOSIS — H35033 Hypertensive retinopathy, bilateral: Secondary | ICD-10-CM

## 2019-11-16 DIAGNOSIS — I10 Essential (primary) hypertension: Secondary | ICD-10-CM | POA: Diagnosis not present

## 2019-11-16 DIAGNOSIS — I6523 Occlusion and stenosis of bilateral carotid arteries: Secondary | ICD-10-CM | POA: Diagnosis not present

## 2019-11-17 DIAGNOSIS — H539 Unspecified visual disturbance: Secondary | ICD-10-CM | POA: Diagnosis not present

## 2019-11-17 DIAGNOSIS — H35033 Hypertensive retinopathy, bilateral: Secondary | ICD-10-CM | POA: Diagnosis not present

## 2019-11-19 ENCOUNTER — Other Ambulatory Visit: Payer: No Typology Code available for payment source

## 2019-11-23 DIAGNOSIS — M542 Cervicalgia: Secondary | ICD-10-CM | POA: Diagnosis not present

## 2019-11-23 DIAGNOSIS — I10 Essential (primary) hypertension: Secondary | ICD-10-CM | POA: Diagnosis not present

## 2019-11-23 DIAGNOSIS — S50812A Abrasion of left forearm, initial encounter: Secondary | ICD-10-CM | POA: Diagnosis not present

## 2019-12-01 DIAGNOSIS — M25512 Pain in left shoulder: Secondary | ICD-10-CM | POA: Diagnosis not present

## 2019-12-01 DIAGNOSIS — R1030 Lower abdominal pain, unspecified: Secondary | ICD-10-CM | POA: Diagnosis not present

## 2019-12-01 DIAGNOSIS — T148XXA Other injury of unspecified body region, initial encounter: Secondary | ICD-10-CM | POA: Diagnosis not present

## 2020-05-05 DIAGNOSIS — F419 Anxiety disorder, unspecified: Secondary | ICD-10-CM | POA: Diagnosis not present

## 2020-06-01 DIAGNOSIS — Z0000 Encounter for general adult medical examination without abnormal findings: Secondary | ICD-10-CM | POA: Diagnosis not present

## 2020-06-01 DIAGNOSIS — E039 Hypothyroidism, unspecified: Secondary | ICD-10-CM | POA: Diagnosis not present

## 2020-06-13 DIAGNOSIS — F419 Anxiety disorder, unspecified: Secondary | ICD-10-CM | POA: Diagnosis not present

## 2020-06-13 DIAGNOSIS — F4310 Post-traumatic stress disorder, unspecified: Secondary | ICD-10-CM | POA: Diagnosis not present

## 2020-07-17 DIAGNOSIS — M5412 Radiculopathy, cervical region: Secondary | ICD-10-CM | POA: Diagnosis not present

## 2020-07-17 DIAGNOSIS — M9901 Segmental and somatic dysfunction of cervical region: Secondary | ICD-10-CM | POA: Diagnosis not present

## 2020-07-18 DIAGNOSIS — M5412 Radiculopathy, cervical region: Secondary | ICD-10-CM | POA: Diagnosis not present

## 2020-07-18 DIAGNOSIS — M9901 Segmental and somatic dysfunction of cervical region: Secondary | ICD-10-CM | POA: Diagnosis not present

## 2020-07-25 DIAGNOSIS — M9901 Segmental and somatic dysfunction of cervical region: Secondary | ICD-10-CM | POA: Diagnosis not present

## 2020-07-25 DIAGNOSIS — M5412 Radiculopathy, cervical region: Secondary | ICD-10-CM | POA: Diagnosis not present

## 2020-07-27 DIAGNOSIS — M5412 Radiculopathy, cervical region: Secondary | ICD-10-CM | POA: Diagnosis not present

## 2020-07-27 DIAGNOSIS — M9901 Segmental and somatic dysfunction of cervical region: Secondary | ICD-10-CM | POA: Diagnosis not present

## 2020-08-10 DIAGNOSIS — M7541 Impingement syndrome of right shoulder: Secondary | ICD-10-CM | POA: Diagnosis not present

## 2020-08-22 DIAGNOSIS — M25511 Pain in right shoulder: Secondary | ICD-10-CM | POA: Diagnosis not present

## 2020-08-30 DIAGNOSIS — M25511 Pain in right shoulder: Secondary | ICD-10-CM | POA: Diagnosis not present

## 2020-09-07 DIAGNOSIS — M25511 Pain in right shoulder: Secondary | ICD-10-CM | POA: Diagnosis not present

## 2020-09-11 DIAGNOSIS — M25511 Pain in right shoulder: Secondary | ICD-10-CM | POA: Diagnosis not present

## 2020-09-18 DIAGNOSIS — M25511 Pain in right shoulder: Secondary | ICD-10-CM | POA: Diagnosis not present

## 2020-09-21 DIAGNOSIS — M9901 Segmental and somatic dysfunction of cervical region: Secondary | ICD-10-CM | POA: Diagnosis not present

## 2020-09-21 DIAGNOSIS — M7541 Impingement syndrome of right shoulder: Secondary | ICD-10-CM | POA: Diagnosis not present

## 2020-09-21 DIAGNOSIS — M5412 Radiculopathy, cervical region: Secondary | ICD-10-CM | POA: Diagnosis not present

## 2020-10-18 ENCOUNTER — Encounter: Payer: Self-pay | Admitting: Obstetrics and Gynecology

## 2020-10-25 DIAGNOSIS — M9901 Segmental and somatic dysfunction of cervical region: Secondary | ICD-10-CM | POA: Diagnosis not present

## 2020-10-25 DIAGNOSIS — M5412 Radiculopathy, cervical region: Secondary | ICD-10-CM | POA: Diagnosis not present

## 2020-11-13 DIAGNOSIS — M7541 Impingement syndrome of right shoulder: Secondary | ICD-10-CM | POA: Diagnosis not present

## 2021-02-21 ENCOUNTER — Encounter: Payer: Self-pay | Admitting: Obstetrics and Gynecology

## 2021-05-16 ENCOUNTER — Encounter: Payer: Self-pay | Admitting: Obstetrics and Gynecology

## 2021-05-30 ENCOUNTER — Encounter: Payer: Self-pay | Admitting: Obstetrics and Gynecology

## 2021-08-14 ENCOUNTER — Encounter: Payer: Self-pay | Admitting: Obstetrics and Gynecology

## 2021-08-14 ENCOUNTER — Other Ambulatory Visit (HOSPITAL_COMMUNITY)
Admission: RE | Admit: 2021-08-14 | Discharge: 2021-08-14 | Disposition: A | Discharge status: Home / Self Care | Payer: No Typology Code available for payment source | Source: Ambulatory Visit | Attending: Obstetrics and Gynecology | Admitting: Obstetrics and Gynecology

## 2021-08-14 ENCOUNTER — Ambulatory Visit (INDEPENDENT_AMBULATORY_CARE_PROVIDER_SITE_OTHER): Payer: No Typology Code available for payment source | Admitting: Obstetrics and Gynecology

## 2021-08-14 VITALS — BP 124/76 | HR 62 | Ht 67.5 in | Wt 262.0 lb

## 2021-08-14 DIAGNOSIS — Z3009 Encounter for other general counseling and advice on contraception: Secondary | ICD-10-CM

## 2021-08-14 DIAGNOSIS — Z124 Encounter for screening for malignant neoplasm of cervix: Secondary | ICD-10-CM | POA: Insufficient documentation

## 2021-08-14 DIAGNOSIS — Z01419 Encounter for gynecological examination (general) (routine) without abnormal findings: Secondary | ICD-10-CM | POA: Diagnosis not present

## 2021-08-14 NOTE — Patient Instructions (Signed)

## 2021-08-14 NOTE — Progress Notes (Signed)
46 y.o. No obstetric history on file. Single White or Caucasian Not Hispanic or Latino female here for annual exam.  She is not having any problems. Never sexually active. She would like to have a baby.  ?Period Cycle (Days): 28 ?Period Duration (Days): 3-5 ?Period Pattern: Regular ?Menstrual Flow: Moderate ?Menstrual Control: Thin pad ?Menstrual Control Change Freq (Hours): 4 ?Dysmenorrhea: (!) Mild ?Dysmenorrhea Symptoms: Cramping, Diarrhea ? ?Patient's last menstrual period was 08/03/2021.          ?Sexually active: No.  ?The current method of family planning is none.    ?Exercising: Yes.     Walking  ?Smoker:  no ? ?Health Maintenance: ?Pap:  10 years ago  ?History of abnormal Pap:  no ?MMG:  09/29/19 Normal, scheduled for 7/23.   ?BMD:   none  ?Colonoscopy: none  ?TDaP:  10/10/16  ?Gardasil: none  ? ? reports that she has never smoked. She has never used smokeless tobacco. She reports that she does not drink alcohol and does not use drugs. Social ETOH. She is a CMA, Scientist, physiological.  ? ?Past Medical History:  ?Diagnosis Date  ? Cancer [facility])   ? thyroid  ? Hypertension   ? Venous insufficiency   ? ? ?Past Surgical History:  ?Procedure Laterality Date  ? CERVICAL SPINE SURGERY    ? ENDOVENOUS ABLATION SAPHENOUS VEIN W/ LASER  01/23/2012  ? left greater saphenous vein by  Dr. Donnetta Hutching  ? FOOT SURGERY    ? OPEN ANTERIOR SHOULDER RECONSTRUCTION    ? THYROIDECTOMY    ?Shoulder surgery was in 2/23.  ? ?Current Outpatient Medications  ?Medication Sig Dispense Refill  ? cetirizine (ZYRTEC) 10 MG tablet Take 10 mg by mouth at bedtime.    ? LORazepam (ATIVAN) 0.5 MG tablet TAKE 1 TABLET AS NEEDED EVERY 8 HRS ORALLY  1  ? metoprolol succinate (TOPROL-XL) 50 MG 24 hr tablet Take 50 mg by mouth as needed. Take with or immediately following a meal.    ? SYNTHROID 125 MCG tablet PLEASE SEE ATTACHED FOR DETAILED DIRECTIONS  1  ? traMADol (ULTRAM) 50 MG tablet tramadol 50 mg tablet ? TAKE 1 TABLET EVERY 6 HOURS AS NEEDED  FOR PAIN    ? valsartan (DIOVAN) 80 MG tablet valsartan 80 mg tablet    ? ?No current facility-administered medications for this visit.  ? ? ?Family History  ?Problem Relation Age of Onset  ? Hyperlipidemia Mother   ? Hypertension Mother   ? Cancer Father 40  ?     testicular cancer  ? Hyperlipidemia Father   ? Hypertension Father   ? ? ?Review of Systems  ?All other systems reviewed and are negative. ? ?Exam:   ?BP 124/76   Pulse 62   Ht 5' 7.5" (1.715 m)   Wt 262 lb (118.8 kg)   LMP 08/03/2021   SpO2 100%   BMI 40.43 kg/m?   Weight change: '@WEIGHTCHANGE'$ @ Height:   Height: 5' 7.5" (171.5 cm)  ?Ht Readings from Last 3 Encounters:  ?08/14/21 5' 7.5" (1.715 m)  ?04/18/17 5' 7.5" (1.715 m)  ?06/22/16 '5\' 7"'$  (1.702 m)  ? ? ?General appearance: alert, cooperative and appears stated age ?Head: Normocephalic, without obvious abnormality, atraumatic ?Neck: no adenopathy, supple, symmetrical, trachea midline and thyroid normal to inspection and palpation ?Lungs: clear to auscultation bilaterally ?Cardiovascular: regular rate and rhythm ?Breasts: normal appearance, no masses or tenderness ?Abdomen: soft, non-tender; non distended,  no masses,  no organomegaly ?Extremities: extremities normal, atraumatic,  no cyanosis or edema ?Skin: Skin color, texture, turgor normal. No rashes or lesions ?Lymph nodes: Cervical, supraclavicular, and axillary nodes normal. ?No abnormal inguinal nodes palpated ?Neurologic: Grossly normal ? ? ?Pelvic: External genitalia:  no lesions ?             Urethra:  normal appearing urethra with no masses, tenderness or lesions ?             Bartholins and Skenes: normal    ?             Vagina: normal appearing vagina with normal color and discharge, no lesions ?             Cervix: no lesions ?              ?Bimanual Exam:  Uterus:   no masses or tenderness  ?             Adnexa: no mass, fullness, tenderness ?              Rectovaginal: Confirms ?              Anus:  normal sphincter tone, no  lesions ? ?Lovena Le, CMA chaperoned for the exam. ? ? ?1. Well woman exam ?Discussed breast self exam ?Discussed calcium and vit D intake ?Labs with primary ?Mammogram is scheduled ? ?2. Screening for cervical cancer ?- Cytology - PAP ? ?3. Family planning ?She would like to have a child ?Will refer to Dr Kerin Perna for evaluation and to discuss her options ? ? ?

## 2021-08-16 LAB — CYTOLOGY - PAP
Comment: NEGATIVE
Diagnosis: NEGATIVE
High risk HPV: NEGATIVE

## 2021-09-04 DIAGNOSIS — Z9889 Other specified postprocedural states: Secondary | ICD-10-CM | POA: Insufficient documentation

## 2022-08-20 ENCOUNTER — Ambulatory Visit: Payer: No Typology Code available for payment source | Admitting: Obstetrics and Gynecology

## 2022-08-22 ENCOUNTER — Encounter: Payer: Self-pay | Admitting: Obstetrics and Gynecology

## 2022-08-22 ENCOUNTER — Ambulatory Visit (INDEPENDENT_AMBULATORY_CARE_PROVIDER_SITE_OTHER): Payer: 59 | Admitting: Obstetrics and Gynecology

## 2022-08-22 VITALS — BP 108/80 | HR 78 | Resp 16 | Ht 66.5 in | Wt 244.0 lb

## 2022-08-22 DIAGNOSIS — Z01419 Encounter for gynecological examination (general) (routine) without abnormal findings: Secondary | ICD-10-CM

## 2022-08-22 DIAGNOSIS — Z8679 Personal history of other diseases of the circulatory system: Secondary | ICD-10-CM | POA: Diagnosis not present

## 2022-08-22 NOTE — Progress Notes (Signed)
47 y.o. G0. Single White or Caucasian Not Hispanic or Latino female here for annual exam.  Never sexually active. She would like to have a child, she is considering a consultation with the fertility specialist and is also considering adoption.  Period Cycle (Days): 27 Period Duration (Days): 3-5 Period Pattern: Regular Menstrual Flow: Moderate Menstrual Control: Maxi pad Menstrual Control Change Freq (Hours): changes pad when she goes to the bathroom Dysmenorrhea: (!) Mild Dysmenorrhea Symptoms: Cramping  BP is okay at home. On medication.   She has been walking has lost 15 lbs since January. Eating more plant based diet. Drinking lots of water.   Patient's last menstrual period was 07/27/2022.          Sexually active: No.  The current method of family planning is abstinence.    Exercising: Yes.     Walking  Smoker:  no  Health Maintenance: Pap:  08-14-21 negative, HR HPV negative  History of abnormal Pap:  no MMG:  08-20-22 normal per patient-- done [facility]  BMD:   none Colonoscopy: none TDaP:  2018  Gardasil: no   reports that she has never smoked. She has never used smokeless tobacco. She reports that she does not drink alcohol and does not use drugs. She is a CMA, Astronomer.   Past Medical History:  Diagnosis Date   Cancer ([facility])    thyroid   Hypertension    Venous insufficiency     Past Surgical History:  Procedure Laterality Date   CERVICAL SPINE SURGERY     ENDOVENOUS ABLATION SAPHENOUS VEIN W/ LASER  01/23/2012   left greater saphenous vein by  Dr. Arbie Cookey   FOOT SURGERY     OPEN ANTERIOR SHOULDER RECONSTRUCTION     THYROIDECTOMY      Current Outpatient Medications  Medication Sig Dispense Refill   cetirizine (ZYRTEC) 10 MG tablet Take 10 mg by mouth at bedtime.     nebivolol (BYSTOLIC) 10 MG tablet Take 10 mg by mouth every morning.     rosuvastatin (CRESTOR) 10 MG tablet Take 10 mg by mouth once a week.     SYNTHROID 112 MCG tablet Take  112 mcg by mouth every morning.     valsartan (DIOVAN) 80 MG tablet valsartan 80 mg tablet     No current facility-administered medications for this visit.    Family History  Problem Relation Age of Onset   Hyperlipidemia Mother    Hypertension Mother    Cancer Father 5       testicular cancer   Hyperlipidemia Father    Hypertension Father     Review of Systems  All other systems reviewed and are negative.   Exam:   BP (!) 142/86   Pulse 78   Resp 16   Ht 5' 6.5" (1.689 m)   Wt 244 lb (110.7 kg)   LMP 07/27/2022   BMI 38.79 kg/m   Weight change: @WEIGHTCHANGE @ Height:   Height: 5' 6.5" (168.9 cm)  Ht Readings from Last 3 Encounters:  08/22/22 5' 6.5" (1.689 m)  08/14/21 5' 7.5" (1.715 m)  04/18/17 5' 7.5" (1.715 m)    General appearance: alert, cooperative and appears stated age Head: Normocephalic, without obvious abnormality, atraumatic Neck: no adenopathy, supple, symmetrical, trachea midline and thyroid normal to inspection and palpation Lungs: clear to auscultation bilaterally Cardiovascular: regular rate and rhythm Breasts: normal appearance, no masses or tenderness Abdomen: soft, non-tender; non distended,  no masses,  no organomegaly Extremities: extremities normal,  atraumatic, no cyanosis or edema Skin: Skin color, texture, turgor normal. No rashes or lesions Lymph nodes: Cervical, supraclavicular, and axillary nodes normal. No abnormal inguinal nodes palpated Neurologic: Grossly normal   Pelvic: External genitalia:  no lesions              Urethra:  normal appearing urethra with no masses, tenderness or lesions              Bartholins and Skenes: normal                 Vagina: normal appearing vagina with normal color and discharge, no lesions              Cervix: no lesions               Bimanual Exam:  Uterus:  normal size, contour, position, consistency, mobility, non-tender              Adnexa: no mass, fullness, tenderness                Rectovaginal: Confirms               Anus:  normal sphincter tone, no lesions  Arnold Long, RN chaperoned for the exam.   1. Well woman exam Discussed breast self exam Discussed calcium and vit D intake No pap this year Labs with primary Mammogram just done, she received notice that it was normal Due for colon cancer screening after she turns 47  2. History of hypertension On medication, reports normal BP at home. Slightly elevated today, will recheck Repeat BP 108/80

## 2022-11-11 DIAGNOSIS — S060X0A Concussion without loss of consciousness, initial encounter: Secondary | ICD-10-CM | POA: Diagnosis not present

## 2022-11-20 DIAGNOSIS — Z0000 Encounter for general adult medical examination without abnormal findings: Secondary | ICD-10-CM | POA: Diagnosis not present

## 2022-11-20 DIAGNOSIS — E7800 Pure hypercholesterolemia, unspecified: Secondary | ICD-10-CM | POA: Diagnosis not present

## 2022-11-20 DIAGNOSIS — I10 Essential (primary) hypertension: Secondary | ICD-10-CM | POA: Diagnosis not present

## 2022-11-20 DIAGNOSIS — F411 Generalized anxiety disorder: Secondary | ICD-10-CM | POA: Diagnosis not present

## 2022-11-20 DIAGNOSIS — E039 Hypothyroidism, unspecified: Secondary | ICD-10-CM | POA: Diagnosis not present

## 2022-12-17 DIAGNOSIS — R509 Fever, unspecified: Secondary | ICD-10-CM | POA: Diagnosis not present

## 2022-12-17 DIAGNOSIS — U071 COVID-19: Secondary | ICD-10-CM | POA: Diagnosis not present

## 2022-12-17 DIAGNOSIS — R051 Acute cough: Secondary | ICD-10-CM | POA: Diagnosis not present

## 2023-04-03 DIAGNOSIS — M62830 Muscle spasm of back: Secondary | ICD-10-CM | POA: Diagnosis not present

## 2023-04-03 DIAGNOSIS — S3992XA Unspecified injury of lower back, initial encounter: Secondary | ICD-10-CM | POA: Diagnosis not present

## 2023-04-03 DIAGNOSIS — S299XXA Unspecified injury of thorax, initial encounter: Secondary | ICD-10-CM | POA: Diagnosis not present

## 2023-04-03 DIAGNOSIS — W19XXXA Unspecified fall, initial encounter: Secondary | ICD-10-CM | POA: Diagnosis not present

## 2023-04-22 DIAGNOSIS — Z1211 Encounter for screening for malignant neoplasm of colon: Secondary | ICD-10-CM | POA: Diagnosis not present

## 2023-04-22 DIAGNOSIS — K219 Gastro-esophageal reflux disease without esophagitis: Secondary | ICD-10-CM | POA: Diagnosis not present

## 2023-04-22 DIAGNOSIS — I10 Essential (primary) hypertension: Secondary | ICD-10-CM | POA: Diagnosis not present

## 2023-04-22 DIAGNOSIS — E669 Obesity, unspecified: Secondary | ICD-10-CM | POA: Diagnosis not present

## 2023-05-22 DIAGNOSIS — S99922A Unspecified injury of left foot, initial encounter: Secondary | ICD-10-CM | POA: Diagnosis not present

## 2023-05-22 DIAGNOSIS — M25572 Pain in left ankle and joints of left foot: Secondary | ICD-10-CM | POA: Diagnosis not present

## 2023-05-30 DIAGNOSIS — M79672 Pain in left foot: Secondary | ICD-10-CM | POA: Diagnosis not present

## 2023-06-10 DIAGNOSIS — M79672 Pain in left foot: Secondary | ICD-10-CM | POA: Diagnosis not present

## 2023-06-17 DIAGNOSIS — S93525A Sprain of metatarsophalangeal joint of left lesser toe(s), initial encounter: Secondary | ICD-10-CM | POA: Diagnosis not present

## 2023-07-03 DIAGNOSIS — S93525A Sprain of metatarsophalangeal joint of left lesser toe(s), initial encounter: Secondary | ICD-10-CM | POA: Diagnosis not present

## 2023-08-22 DIAGNOSIS — K648 Other hemorrhoids: Secondary | ICD-10-CM | POA: Diagnosis not present

## 2023-08-22 DIAGNOSIS — K635 Polyp of colon: Secondary | ICD-10-CM | POA: Diagnosis not present

## 2023-08-22 DIAGNOSIS — E039 Hypothyroidism, unspecified: Secondary | ICD-10-CM | POA: Diagnosis not present

## 2023-08-22 DIAGNOSIS — K6389 Other specified diseases of intestine: Secondary | ICD-10-CM | POA: Diagnosis not present

## 2023-08-22 DIAGNOSIS — Z1211 Encounter for screening for malignant neoplasm of colon: Secondary | ICD-10-CM | POA: Diagnosis not present

## 2023-08-25 DIAGNOSIS — Z1231 Encounter for screening mammogram for malignant neoplasm of breast: Secondary | ICD-10-CM | POA: Diagnosis not present

## 2023-08-27 ENCOUNTER — Ambulatory Visit (INDEPENDENT_AMBULATORY_CARE_PROVIDER_SITE_OTHER): Payer: BC Managed Care – PPO | Admitting: Obstetrics and Gynecology

## 2023-08-27 ENCOUNTER — Encounter: Payer: Self-pay | Admitting: Obstetrics and Gynecology

## 2023-08-27 VITALS — BP 118/78 | HR 71 | Ht 66.25 in | Wt 238.0 lb

## 2023-08-27 DIAGNOSIS — Z1231 Encounter for screening mammogram for malignant neoplasm of breast: Secondary | ICD-10-CM

## 2023-08-27 DIAGNOSIS — Z1331 Encounter for screening for depression: Secondary | ICD-10-CM | POA: Diagnosis not present

## 2023-08-27 DIAGNOSIS — Z3141 Encounter for fertility testing: Secondary | ICD-10-CM

## 2023-08-27 DIAGNOSIS — Z01419 Encounter for gynecological examination (general) (routine) without abnormal findings: Secondary | ICD-10-CM | POA: Diagnosis not present

## 2023-08-27 NOTE — Patient Instructions (Signed)
 It was so nice meeting you today!  Dr. Yalcinkaya at [facility] [phone]  Look at Dr. Adelaida Adie program and donor embryo program in [place], [place]

## 2023-08-27 NOTE — Progress Notes (Signed)
 48 y.o. y.o. female here for annual exam. Patient's last menstrual period was 08/19/2023 (exact date). Period Duration (Days): 3-4 Period Pattern: Regular Menstrual Flow: Moderate Menstrual Control: Maxi pad Dysmenorrhea: (!) Mild Dysmenorrhea Symptoms: Cramping Q month not bothered by the cramping. Walking helps Sexually active: No.  The current method of family planning is abstinence.    Exercising: Yes.    Walking  Smoker:  no  Health Maintenance: Pap:  08-14-21 negative, HR HPV negative repeat in 3 years History of abnormal Pap:  no MMG:  08-19-21 normal per patient-- done at [facility]  BMD:   none Colonoscopy: 08/22/23 TDaP:  2018  Gardasil: no Body mass index is 38.12 kg/m. Desires conception with donor egg. Referral to Dr. Andree Bane     08/27/2023    2:55 PM  Depression screen PHQ 2/9  Decreased Interest 0  Down, Depressed, Hopeless 0  PHQ - 2 Score 0    Blood pressure 118/78, pulse 71, height 5' 6.25" (1.683 m), weight 238 lb (108 kg), last menstrual period 08/19/2023, SpO2 99%.     Component Value Date/Time   DIAGPAP  08/14/2021 1527    - Negative for intraepithelial lesion or malignancy (NILM)   HPVHIGH Negative 08/14/2021 1527   ADEQPAP  08/14/2021 1527    Satisfactory for evaluation; transformation zone component PRESENT.    GYN HISTORY:    Component Value Date/Time   DIAGPAP  08/14/2021 1527    - Negative for intraepithelial lesion or malignancy (NILM)   HPVHIGH Negative 08/14/2021 1527   ADEQPAP  08/14/2021 1527    Satisfactory for evaluation; transformation zone component PRESENT.    OB History  Gravida Para Term Preterm AB Living  0 0 0 0 0 0  SAB IAB Ectopic Multiple Live Births  0 0 0 0 0    Past Medical History:  Diagnosis Date   Cancer (HCC)    thyroid    Hypertension    Torn ligament    foot   Venous insufficiency     Past Surgical History:  Procedure Laterality Date   CERVICAL SPINE SURGERY     ENDOVENOUS ABLATION  SAPHENOUS VEIN W/ LASER  01/23/2012   left greater saphenous vein by  Dr. Shirley Douglas   FOOT SURGERY     OPEN ANTERIOR SHOULDER RECONSTRUCTION     THYROIDECTOMY      Current Outpatient Medications on File Prior to Visit  Medication Sig Dispense Refill   cetirizine (ZYRTEC) 10 MG tablet Take 10 mg by mouth at bedtime.     nebivolol (BYSTOLIC) 10 MG tablet Take 10 mg by mouth every morning.     rosuvastatin (CRESTOR) 10 MG tablet Take 10 mg by mouth once a week.     SYNTHROID 125 MCG tablet Take 125 mcg by mouth daily.     valsartan (DIOVAN) 80 MG tablet valsartan 80 mg tablet     No current facility-administered medications on file prior to visit.    Social History   Socioeconomic History   Marital status: Single    Spouse name: Not on file   Number of children: Not on file   Years of education: Not on file   Highest education level: Not on file  Occupational History   Not on file  Tobacco Use   Smoking status: Never   Smokeless tobacco: Never  Vaping Use   Vaping status: Never Used  Substance and Sexual Activity   Alcohol use: No    Alcohol/week: 1.0 - 2.0 standard drink  of alcohol    Types: 1 - 2 Glasses of wine per week   Drug use: No   Sexual activity: Not Currently    Partners: Male    Birth control/protection: Abstinence  Other Topics Concern   Not on file  Social History Narrative   Not on file   Social Drivers of Health   Financial Resource Strain: Not on file  Food Insecurity: Not on file  Transportation Needs: Not on file  Physical Activity: Not on file  Stress: Not on file  Social Connections: Unknown (08/14/2021)   Received from [facility], [facility]   Social Network    Social Network: Not on file  Intimate Partner Violence: Unknown (07/12/2021)   Received from [employer], [facility]   HITS    Physically Hurt: Not on file    Insult or Talk Down To: Not on file    Threaten Physical Harm: Not on file    Scream or Curse: Not on file     Family History  Problem Relation Age of Onset   Hyperlipidemia Mother    Hypertension Mother    Cancer Father 57       testicular cancer   Hyperlipidemia Father    Hypertension Father      Allergies  Allergen Reactions   Erythromycin     Pt states that she has severe stomach pain   Hydrochlorothiazide Other (See Comments)   Meloxicam Swelling    Ankle swelling    Prednisone     With high doses (50mg )   Lisinopril Cough   Losartan Other (See Comments)    Tremors, lightheaded    Vancomycin       Patient's last menstrual period was Patient's last menstrual period was 08/19/2023 (exact date)..            Review of Systems Alls systems reviewed and are negative.     Physical Exam Constitutional:      Appearance: Normal appearance.  Genitourinary:     Vulva and urethral meatus normal.     No lesions in the vagina.     Right Labia: No rash, lesions or skin changes.    Left Labia: No lesions, skin changes or rash.    No vaginal discharge or tenderness.     No vaginal prolapse present.    No vaginal atrophy present.     Right Adnexa: not tender, not palpable and no mass present.    Left Adnexa: not tender, not palpable and no mass present.    No cervical motion tenderness or discharge.     Uterus is not enlarged, tender or irregular.  Breasts:    Right: Normal.     Left: Normal.  HENT:     Head: Normocephalic.  Neck:     Thyroid : No thyroid  mass, thyromegaly or thyroid  tenderness.  Cardiovascular:     Rate and Rhythm: Normal rate and regular rhythm.     Heart sounds: Normal heart sounds, S1 normal and S2 normal.  Pulmonary:     Effort: Pulmonary effort is normal.     Breath sounds: Normal breath sounds and air entry.  Abdominal:     General: There is no distension.     Palpations: Abdomen is soft. There is no mass.     Tenderness: There is no abdominal tenderness. There is no guarding or rebound.  Musculoskeletal:        General: Normal range of  motion.     Cervical back: Full passive range  of motion without pain, normal range of motion and neck supple. No tenderness.     Right lower leg: No edema.     Left lower leg: No edema.  Neurological:     Mental Status: She is alert.  Skin:    General: Skin is warm.  Psychiatric:        Mood and Affect: Mood normal.        Behavior: Behavior normal.        Thought Content: Thought content normal.  Vitals and nursing note reviewed. Exam conducted with a chaperone present.       A:         Well Woman GYN exam                             P:        Pap smear not indicated Encouraged annual mammogram screening Colon cancer screening up-to-date DXA not indicated Labs and immunizations to do with PMD Discussed breast self exams Encouraged healthy lifestyle practices   No follow-ups on file.  Reinaldo Caras

## 2023-10-03 DIAGNOSIS — S93525A Sprain of metatarsophalangeal joint of left lesser toe(s), initial encounter: Secondary | ICD-10-CM | POA: Diagnosis not present

## 2023-10-24 DIAGNOSIS — M79672 Pain in left foot: Secondary | ICD-10-CM | POA: Diagnosis not present

## 2023-10-31 DIAGNOSIS — M79672 Pain in left foot: Secondary | ICD-10-CM | POA: Diagnosis not present

## 2023-11-07 DIAGNOSIS — M79672 Pain in left foot: Secondary | ICD-10-CM | POA: Diagnosis not present

## 2023-11-14 DIAGNOSIS — M79672 Pain in left foot: Secondary | ICD-10-CM | POA: Diagnosis not present

## 2023-11-21 DIAGNOSIS — M79672 Pain in left foot: Secondary | ICD-10-CM | POA: Diagnosis not present

## 2023-12-16 DIAGNOSIS — Z0000 Encounter for general adult medical examination without abnormal findings: Secondary | ICD-10-CM | POA: Diagnosis not present

## 2023-12-16 DIAGNOSIS — E559 Vitamin D deficiency, unspecified: Secondary | ICD-10-CM | POA: Diagnosis not present

## 2023-12-16 DIAGNOSIS — F419 Anxiety disorder, unspecified: Secondary | ICD-10-CM | POA: Diagnosis not present

## 2023-12-16 DIAGNOSIS — I10 Essential (primary) hypertension: Secondary | ICD-10-CM | POA: Diagnosis not present

## 2023-12-16 DIAGNOSIS — E039 Hypothyroidism, unspecified: Secondary | ICD-10-CM | POA: Diagnosis not present

## 2024-01-11 DIAGNOSIS — N3000 Acute cystitis without hematuria: Secondary | ICD-10-CM | POA: Diagnosis not present

## 2024-09-01 ENCOUNTER — Ambulatory Visit: Admitting: Obstetrics and Gynecology
